# Patient Record
Sex: Male | Born: 2000 | Race: Black or African American | Hispanic: No | Marital: Single | Smoking: Current every day smoker
Health system: Southern US, Community
[De-identification: ages and names within clinical notes are randomized; demographics above are authoritative.]

## PROBLEM LIST (undated history)

## (undated) DIAGNOSIS — R630 Anorexia: Secondary | ICD-10-CM

---
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
## 2019-04-23 ENCOUNTER — Emergency Department (HOSPITAL_COMMUNITY)
Admission: EM | Admit: 2019-04-23 | Discharge: 2019-04-24 | Disposition: A | Discharge status: Home / Self Care | Payer: Medicaid Other | Attending: Emergency Medicine | Admitting: Emergency Medicine

## 2019-04-23 ENCOUNTER — Encounter (HOSPITAL_COMMUNITY): Payer: Self-pay

## 2019-04-23 DIAGNOSIS — X58XXXA Exposure to other specified factors, initial encounter: Secondary | ICD-10-CM | POA: Insufficient documentation

## 2019-04-23 DIAGNOSIS — S0502XA Injury of conjunctiva and corneal abrasion without foreign body, left eye, initial encounter: Secondary | ICD-10-CM | POA: Insufficient documentation

## 2019-04-23 DIAGNOSIS — Y9384 Activity, sleeping: Secondary | ICD-10-CM | POA: Diagnosis not present

## 2019-04-23 DIAGNOSIS — Y999 Unspecified external cause status: Secondary | ICD-10-CM | POA: Insufficient documentation

## 2019-04-23 DIAGNOSIS — Y929 Unspecified place or not applicable: Secondary | ICD-10-CM | POA: Diagnosis not present

## 2019-04-23 DIAGNOSIS — S0592XA Unspecified injury of left eye and orbit, initial encounter: Secondary | ICD-10-CM | POA: Diagnosis present

## 2019-04-23 MED ORDER — TETRACAINE HCL 0.5 % OP SOLN
2.0000 [drp] | Freq: Once | OPHTHALMIC | Status: AC
  Administered 2019-04-24: 2 [drp] via OPHTHALMIC
  Filled 2019-04-23: qty 4

## 2019-04-23 MED ORDER — FLUORESCEIN SODIUM 1 MG OP STRP
1.0000 | ORAL_STRIP | Freq: Once | OPHTHALMIC | Status: AC
  Administered 2019-04-24: 1 via OPHTHALMIC
  Filled 2019-04-23: qty 1

## 2019-04-23 NOTE — ED Triage Notes (Signed)
Pt states that he wore contacts yesterday for Halloween and now eyes are painful

## 2019-04-24 MED ORDER — CIPROFLOXACIN HCL 0.3 % OP SOLN
1.0000 [drp] | OPHTHALMIC | Status: DC
  Administered 2019-04-24: 01:00:00 1 [drp] via OPHTHALMIC

## 2019-04-24 MED ORDER — CIPROFLOXACIN HCL 0.3 % OP SOLN
1.0000 [drp] | OPHTHALMIC | Status: DC
  Filled 2019-04-24: qty 2.5

## 2019-04-24 NOTE — ED Notes (Signed)
Patient verbalizes understanding of discharge instructions. Opportunity for questioning and answers were provided. Armband removed by staff, pt discharged from ED.  

## 2019-04-24 NOTE — Discharge Instructions (Addendum)
Instill 1-2 drops in each eye every 4 hours for the next 5 days.  If your symptoms have not resolved in 2 days, please contact the eye doctor listed.
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
## 2019-06-22 ENCOUNTER — Ambulatory Visit (HOSPITAL_COMMUNITY)
Admission: EM | Admit: 2019-06-22 | Discharge: 2019-06-22 | Disposition: A | Discharge status: Home / Self Care | Payer: Medicaid Other | Attending: Family Medicine | Admitting: Family Medicine

## 2019-06-22 ENCOUNTER — Encounter (HOSPITAL_COMMUNITY): Payer: Self-pay

## 2019-06-22 ENCOUNTER — Other Ambulatory Visit: Payer: Self-pay

## 2019-06-22 DIAGNOSIS — Z20822 Contact with and (suspected) exposure to covid-19: Secondary | ICD-10-CM

## 2019-06-22 DIAGNOSIS — Z20828 Contact with and (suspected) exposure to other viral communicable diseases: Secondary | ICD-10-CM | POA: Insufficient documentation

## 2019-06-22 NOTE — ED Triage Notes (Signed)
Pt. Is here denying ANY symptoms/exposures, just wants COVID testing.

## 2019-06-22 NOTE — ED Provider Notes (Signed)
BP (!) 128/57 (BP Location: Right Arm)   Pulse 84   Temp 98.8 F (37.1 C) (Oral)   Resp 16   SpO2 99%   This is a healthy 18 year old.  He is requesting coronavirus testing.  He denies coronavirus exposure.  He denies coronavirus symptoms.  His reason for testing is that he works in [employer].  Covid testing is performed  Covid information is given to patient and discussed   Raylene Everts, MD 06/22/19 2008

## 2019-06-22 NOTE — Discharge Instructions (Signed)
Your test result will be available on My Chart

## 2019-06-23 LAB — NOVEL CORONAVIRUS, NAA (HOSP ORDER, SEND-OUT TO REF LAB; TAT 18-24 HRS): SARS-CoV-2, NAA: NOT DETECTED

## 2019-07-17 ENCOUNTER — Encounter (HOSPITAL_COMMUNITY): Payer: Self-pay | Admitting: Emergency Medicine

## 2019-07-17 ENCOUNTER — Inpatient Hospital Stay (HOSPITAL_COMMUNITY)
Admission: AD | Admit: 2019-07-17 | Payer: Federal, State, Local not specified - Other | Source: Intra-hospital | Admitting: Psychiatry

## 2019-07-17 ENCOUNTER — Other Ambulatory Visit: Payer: Self-pay

## 2019-07-17 ENCOUNTER — Emergency Department (HOSPITAL_COMMUNITY): Payer: Medicaid Other

## 2019-07-17 ENCOUNTER — Emergency Department (HOSPITAL_COMMUNITY)
Admission: EM | Admit: 2019-07-17 | Discharge: 2019-07-18 | Disposition: A | Discharge status: Other Acute Inpatient Hospital | Payer: Medicaid Other | Attending: Emergency Medicine | Admitting: Emergency Medicine

## 2019-07-17 DIAGNOSIS — Z20822 Contact with and (suspected) exposure to covid-19: Secondary | ICD-10-CM | POA: Diagnosis not present

## 2019-07-17 DIAGNOSIS — Y929 Unspecified place or not applicable: Secondary | ICD-10-CM | POA: Diagnosis not present

## 2019-07-17 DIAGNOSIS — R45851 Suicidal ideations: Secondary | ICD-10-CM | POA: Insufficient documentation

## 2019-07-17 DIAGNOSIS — S3992XA Unspecified injury of lower back, initial encounter: Secondary | ICD-10-CM | POA: Diagnosis present

## 2019-07-17 DIAGNOSIS — Y999 Unspecified external cause status: Secondary | ICD-10-CM | POA: Insufficient documentation

## 2019-07-17 DIAGNOSIS — F329 Major depressive disorder, single episode, unspecified: Secondary | ICD-10-CM | POA: Diagnosis not present

## 2019-07-17 DIAGNOSIS — S39012A Strain of muscle, fascia and tendon of lower back, initial encounter: Secondary | ICD-10-CM | POA: Diagnosis not present

## 2019-07-17 DIAGNOSIS — Y939 Activity, unspecified: Secondary | ICD-10-CM | POA: Diagnosis not present

## 2019-07-17 DIAGNOSIS — J45909 Unspecified asthma, uncomplicated: Secondary | ICD-10-CM | POA: Diagnosis not present

## 2019-07-17 LAB — BASIC METABOLIC PANEL
Anion gap: 9 (ref 5–15)
BUN: 10 mg/dL (ref 6–20)
CO2: 26 mmol/L (ref 22–32)
Calcium: 9.8 mg/dL (ref 8.9–10.3)
Chloride: 104 mmol/L (ref 98–111)
Creatinine, Ser: 0.72 mg/dL (ref 0.61–1.24)
GFR calc Af Amer: >60 mL/min (ref >60)
GFR calc non Af Amer: >60 mL/min (ref >60)
Glucose, Bld: 89 mg/dL (ref 70–99)
Potassium: 3.5 mmol/L (ref 3.5–5.1)
Sodium: 139 mmol/L (ref 135–145)

## 2019-07-17 LAB — ETHANOL: Alcohol, Ethyl (B): <10 mg/dL (ref <10)

## 2019-07-17 LAB — CBC WITH DIFFERENTIAL/PLATELET
Abs Immature Granulocytes: 0.01 10*3/uL (ref 0.00–0.07)
Basophils Absolute: 0 10*3/uL (ref 0.0–0.1)
Basophils Relative: 0 %
Eosinophils Absolute: 0.1 10*3/uL (ref 0.0–0.5)
Eosinophils Relative: 1 %
HCT: 43.7 % (ref 39.0–52.0)
Hemoglobin: 15 g/dL (ref 13.0–17.0)
Immature Granulocytes: 0 %
Lymphocytes Relative: 38 %
Lymphs Abs: 1.8 10*3/uL (ref 0.7–4.0)
MCH: 27.1 pg (ref 26.0–34.0)
MCHC: 34.3 g/dL (ref 30.0–36.0)
MCV: 79 fL — ABNORMAL LOW (ref 80.0–100.0)
Monocytes Absolute: 0.4 10*3/uL (ref 0.1–1.0)
Monocytes Relative: 8 %
Neutro Abs: 2.5 10*3/uL (ref 1.7–7.7)
Neutrophils Relative %: 53 %
Platelets: 254 10*3/uL (ref 150–400)
RBC: 5.53 MIL/uL (ref 4.22–5.81)
RDW: 12.3 % (ref 11.5–15.5)
WBC: 4.7 10*3/uL (ref 4.0–10.5)
nRBC: 0 % (ref 0.0–0.2)

## 2019-07-17 LAB — RESPIRATORY PANEL BY RT PCR (FLU A&B, COVID)
Influenza A by PCR: NEGATIVE
Influenza B by PCR: NEGATIVE
SARS Coronavirus 2 by RT PCR: NEGATIVE

## 2019-07-17 LAB — RAPID URINE DRUG SCREEN, HOSP PERFORMED
Amphetamines: NOT DETECTED
Barbiturates: NOT DETECTED
Benzodiazepines: NOT DETECTED
Cocaine: NOT DETECTED
Opiates: NOT DETECTED
Tetrahydrocannabinol: POSITIVE — AB

## 2019-07-17 MED ORDER — IBUPROFEN 400 MG PO TABS
400.0000 mg | ORAL_TABLET | Freq: Once | ORAL | Status: AC
  Administered 2019-07-17: 400 mg via ORAL
  Filled 2019-07-17: qty 1

## 2019-07-17 MED ORDER — ACETAMINOPHEN 500 MG PO TABS
1000.0000 mg | ORAL_TABLET | Freq: Once | ORAL | Status: AC
  Administered 2019-07-17: 17:00:00 1000 mg via ORAL
  Filled 2019-07-17: qty 2

## 2019-07-17 NOTE — ED Notes (Signed)
Patient provided phone to call Mother back; pt was encouraged to inform family of impending transfer to [facility] later [facility]

## 2019-07-17 NOTE — ED Notes (Signed)
Dinner Tray Ordered @ 1709. 

## 2019-07-17 NOTE — ED Notes (Signed)
Patient mother was very rude and upset asking why we can't just have her talk to patient, I told her we would have patient call her back, she got more upset and rude and than hung up.   Please have patient to call his mother if he wants to.

## 2019-07-17 NOTE — ED Notes (Signed)
Pt changed into wine color scrubs. Belongings inventoried and placed in locker 5. Pt also has valuables with security. Cell phone with ear buds.  Pt compliant and calm at  This time.
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
## 2019-07-17 NOTE — ED Notes (Signed)
TTS in process 
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
## 2019-07-17 NOTE — ED Notes (Signed)
Renaye Rakers, NP, patient meets inpatient criteria.  Hassie Bruce, accepted patient to [facility] Adult Unit,  pending negative COVID test.  Room 302 Bed 2.  Attending is Dr. Jama Flavors.  Arrival time after negative COVID test.  Gabriel Rung, RN, informed of acceptance.  Report [phone].

## 2019-07-18 ENCOUNTER — Encounter (HOSPITAL_COMMUNITY): Payer: Self-pay | Admitting: Psychiatry

## 2019-07-18 ENCOUNTER — Inpatient Hospital Stay (HOSPITAL_COMMUNITY)
Admission: AD | Admit: 2019-07-18 | Discharge: 2019-07-21 | DRG: 881 | Disposition: A | Discharge status: Home / Self Care | Payer: Medicaid Other | Source: Intra-hospital | Attending: Psychiatry | Admitting: Psychiatry

## 2019-07-18 DIAGNOSIS — F329 Major depressive disorder, single episode, unspecified: Principal | ICD-10-CM | POA: Diagnosis present

## 2019-07-18 DIAGNOSIS — R5383 Other fatigue: Secondary | ICD-10-CM | POA: Diagnosis present

## 2019-07-18 DIAGNOSIS — F17290 Nicotine dependence, other tobacco product, uncomplicated: Secondary | ICD-10-CM | POA: Diagnosis present

## 2019-07-18 DIAGNOSIS — F332 Major depressive disorder, recurrent severe without psychotic features: Secondary | ICD-10-CM

## 2019-07-18 DIAGNOSIS — G4700 Insomnia, unspecified: Secondary | ICD-10-CM | POA: Diagnosis present

## 2019-07-18 DIAGNOSIS — Z915 Personal history of self-harm: Secondary | ICD-10-CM

## 2019-07-18 DIAGNOSIS — R45851 Suicidal ideations: Secondary | ICD-10-CM | POA: Diagnosis present

## 2019-07-18 HISTORY — DX: Anorexia: R63.0

## 2019-07-18 MED ORDER — THIAMINE HCL 100 MG PO TABS
100.0000 mg | ORAL_TABLET | Freq: Every day | ORAL | Status: DC
  Administered 2019-07-18 – 2019-07-21 (×4): 100 mg via ORAL
  Filled 2019-07-18 (×6): qty 1

## 2019-07-18 MED ORDER — NICOTINE 14 MG/24HR TD PT24
14.0000 mg | MEDICATED_PATCH | Freq: Every day | TRANSDERMAL | Status: DC
  Administered 2019-07-19 – 2019-07-21 (×3): 14 mg via TRANSDERMAL
  Filled 2019-07-18 (×6): qty 1

## 2019-07-18 MED ORDER — NICOTINE 7 MG/24HR TD PT24
7.0000 mg | MEDICATED_PATCH | Freq: Every day | TRANSDERMAL | Status: DC
  Administered 2019-07-18: 7 mg via TRANSDERMAL
  Filled 2019-07-18 (×3): qty 1

## 2019-07-18 MED ORDER — ALUM & MAG HYDROXIDE-SIMETH 200-200-20 MG/5ML PO SUSP: 30.0000 mL | ORAL | Status: DC | PRN

## 2019-07-18 MED ORDER — MAGNESIUM HYDROXIDE 400 MG/5ML PO SUSP: 30.0000 mL | Freq: Every day | ORAL | Status: DC | PRN

## 2019-07-18 MED ORDER — TRAZODONE HCL 50 MG PO TABS
50.0000 mg | ORAL_TABLET | Freq: Once | ORAL | Status: DC
  Filled 2019-07-18: qty 1

## 2019-07-18 MED ORDER — TRAZODONE HCL 50 MG PO TABS
50.0000 mg | ORAL_TABLET | Freq: Every day | ORAL | Status: DC
  Administered 2019-07-18: 50 mg via ORAL
  Filled 2019-07-18 (×2): qty 1

## 2019-07-18 MED ORDER — HYDROXYZINE HCL 25 MG PO TABS
25.0000 mg | ORAL_TABLET | Freq: Three times a day (TID) | ORAL | Status: DC | PRN
  Administered 2019-07-18 – 2019-07-20 (×3): 25 mg via ORAL
  Filled 2019-07-18 (×3): qty 1

## 2019-07-18 MED ORDER — SERTRALINE HCL 25 MG PO TABS
25.0000 mg | ORAL_TABLET | Freq: Every day | ORAL | Status: DC
  Administered 2019-07-18 – 2019-07-19 (×2): 25 mg via ORAL
  Filled 2019-07-18 (×3): qty 1

## 2019-07-18 MED ORDER — TRAZODONE HCL 50 MG PO TABS: 50.0000 mg | ORAL_TABLET | Freq: Every evening | ORAL | Status: DC | PRN

## 2019-07-18 MED ORDER — FOLIC ACID 1 MG PO TABS
1.0000 mg | ORAL_TABLET | Freq: Every day | ORAL | Status: DC
  Administered 2019-07-18 – 2019-07-21 (×4): 1 mg via ORAL
  Filled 2019-07-18 (×6): qty 1

## 2019-07-18 MED ORDER — ACETAMINOPHEN 325 MG PO TABS: 650.0000 mg | ORAL_TABLET | Freq: Four times a day (QID) | ORAL | Status: DC | PRN

## 2019-07-18 NOTE — Progress Notes (Signed)
   07/18/19 2034  Psych Admission Type (Psych Patients Only)  Admission Status Voluntary  Psychosocial Assessment  Patient Complaints Anxiety;Depression  Eye Contact Brief  Facial Expression Anxious;Sad  Affect Depressed  Speech Logical/coherent  Interaction Assertive  Motor Activity Slow  Appearance/Hygiene Unremarkable  Behavior Characteristics Cooperative;Anxious  Thought Process  Coherency WDL  Content WDL  Delusions None reported or observed  Perception WDL  Hallucination None reported or observed  Judgment Poor  Confusion None  Danger to Self  Current suicidal ideation? Denies  Self-Injurious Behavior No self-injurious ideation or behavior indicators observed or expressed   Danger to Others  Danger to Others None reported or observed   Pt came to nurse's station and asked if it was too late for him to leave the facility. Pt stated that he took the medicine this morning and he doesn't feel a change. Educated pt on fact that it takes at least 3-4 weeks before medication takes effect. Pt stated he only got 2 hours sleep today because "y'all kept waking me up." Pt told that he can have medication for sleep tonight and that this is his first full night on the unit and that it is usually more conducive to sleep. Pt ambivalent but willing to try. Pt denies SI, HI, AVH and pain.

## 2019-07-18 NOTE — Progress Notes (Signed)
Patient denies SI, HI and AVH but reports increased anxiety and depression.  Patient states that he would like to find something to decrease his depression and anxiety.   Assess patient for safety.  Offer medications as prescribed.    Patient able to contract for safety. Continue to monitor as planned.

## 2019-07-18 NOTE — Progress Notes (Signed)
Adult Psychoeducational Group Note  Date:  07/18/2019 Time:  9:32 PM  Group Topic/Focus:  Wrap-Up Group:   The focus of this group is to help patients review their daily goal of treatment and discuss progress on daily workbooks.  Participation Level:  Active  Participation Quality:  Appropriate  Affect:  Appropriate  Cognitive:  Appropriate  Insight: Appropriate  Engagement in Group:  Engaged  Modes of Intervention:  Discussion  Additional Comments:  Patient attended group and said that his day was a 4. He did not have any set goal for today. Patient said he was hallucinating. Nurse was notified.    Lynix Bonine W Saleh Ulbrich 07/18/2019, 9:32 PM
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
## 2019-07-18 NOTE — Tx Team (Signed)
Initial Treatment Plan 07/18/2019 2:13 AM Clarene Duke ZOX:[id number]    PATIENT STRESSORS: Financial difficulties Marital or family conflict Occupational concerns Other: Living w/friend and her family-the family doesn't want him there   PATIENT STRENGTHS: Ability for insight Average or above average intelligence Capable of independent living Communication skills Motivation for treatment/growth   PATIENT IDENTIFIED PROBLEMS: Suicidal ideation with plan  Self-neglect  Depression  Homelessness  Possible PTSD (flashbacks from past traumas)             DISCHARGE CRITERIA:  Adequate post-discharge living arrangements Improved stabilization in mood, thinking, and/or behavior Motivation to continue treatment in a less acute level of care Verbal commitment to aftercare and medication compliance  PRELIMINARY DISCHARGE PLAN: Attend PHP/IOP Outpatient therapy Placement in alternative living arrangements Return to previous work or school arrangements  PATIENT/FAMILY INVOLVEMENT: This treatment plan has been presented to and reviewed with the patient, RAWLEIGH RODE, and/or family member.  The patient and family have been given the opportunity to ask questions and make suggestions.  Victorino December, RN 07/18/2019, 2:13 AM

## 2019-07-18 NOTE — Progress Notes (Signed)
Received report from [facility] on pt coming to [phone].
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
## 2019-07-18 NOTE — Progress Notes (Addendum)
   07/18/19 0153  Psych Admission Type (Psych Patients Only)  Admission Status Voluntary  Psychosocial Assessment  Patient Complaints Anxiety;Worrying;Nervousness;Decreased concentration;Depression  Eye Contact Fair  Facial Expression Anxious;Sad  Affect Depressed  Speech Logical/coherent  Interaction Assertive  Motor Activity Slow  Appearance/Hygiene In scrubs;Unremarkable  Behavior Characteristics Cooperative;Anxious  Thought Process  Coherency WDL  Content WDL  Delusions None reported or observed  Perception WDL  Hallucination None reported or observed  Judgment WDL  Confusion None  Danger to Self  Current suicidal ideation? Passive  Self-Injurious Behavior No self-injurious ideation or behavior indicators observed or expressed   Agreement Not to Harm Self Yes  Description of Agreement verbal  Danger to Others  Danger to Others None reported or observed   Pt contracts for safety.

## 2019-07-18 NOTE — Progress Notes (Signed)
Recreation Therapy Notes  Animal-Assisted Activity (AAA) Program Checklist/Progress Notes Patient Eligibility Criteria Checklist & Daily Group note for Rec Tx Intervention  Date: 1.26.21 Time: 1430 Location: [address]   AAA/T Program Assumption of Risk Form signed by Engineer, production or Parent Legal Guardian  YES   Patient is free of allergies or sever asthma  YES   Patient reports no fear of animals  YES   Patient reports no history of cruelty to animals  YES   Patient understands his/her participation is voluntary  YES  Patient washes hands before animal contact  YES   Patient washes hands after animal contact  YES   Behavioral Response: Engaged  Education: Charity fundraiser, Appropriate Animal Interaction   Education Outcome: Acknowledges understanding/In group clarification offered/Needs additional education.   Clinical Observations/Feedback: Pt attended and participated in activity.     Caroll Rancher, LRT/CTRS         Caroll Rancher A 07/18/2019 3:23 PM

## 2019-07-18 NOTE — BHH Counselor (Signed)
CSW attempted to meet with patient to complete PSA. Patient appeared to be sleeping soundly, he did not respond to CSW knocking at the door or calling name several times.  Enid Cutter, MSW, LCSW-A Clinical Social Worker [facility]  [phone]
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
## 2019-07-19 MED ORDER — QUETIAPINE FUMARATE 50 MG PO TABS
50.0000 mg | ORAL_TABLET | Freq: Every day | ORAL | Status: DC
  Administered 2019-07-19: 22:00:00 50 mg via ORAL
  Filled 2019-07-19 (×3): qty 1

## 2019-07-19 MED ORDER — TRAZODONE HCL 50 MG PO TABS
50.0000 mg | ORAL_TABLET | Freq: Every evening | ORAL | Status: DC | PRN
  Administered 2019-07-19: 50 mg via ORAL
  Filled 2019-07-19: qty 1

## 2019-07-19 MED ORDER — SERTRALINE HCL 50 MG PO TABS
50.0000 mg | ORAL_TABLET | Freq: Every day | ORAL | Status: DC
  Administered 2019-07-20 – 2019-07-21 (×2): 50 mg via ORAL
  Filled 2019-07-19 (×4): qty 1

## 2019-07-19 MED ORDER — SERTRALINE HCL 25 MG PO TABS
25.0000 mg | ORAL_TABLET | ORAL | Status: AC
  Administered 2019-07-19: 25 mg via ORAL
  Filled 2019-07-19: qty 1

## 2019-07-19 NOTE — Tx Team (Signed)
Interdisciplinary Treatment and Diagnostic Plan Update  07/19/2019 Time of Session: 9:00am Randy Vasquez MRN: [id number]  Principal Diagnosis: <principal problem not specified>  Secondary Diagnoses: Active Problems:   MDD (major depressive disorder)   Current Medications:  Current Facility-Administered Medications  Medication Dose Route Frequency Provider Last Rate Last Admin  . acetaminophen (TYLENOL) tablet 650 mg  650 mg Oral Q6H PRN Anike, Adaku C, NP      . alum & mag hydroxide-simeth (MAALOX/MYLANTA) 200-200-20 MG/5ML suspension 30 mL  30 mL Oral Q4H PRN Anike, Adaku C, NP      . folic acid (FOLVITE) tablet 1 mg  1 mg Oral Daily Antonieta Pert, MD   1 mg at 07/19/19 0803  . hydrOXYzine (ATARAX/VISTARIL) tablet 25 mg  25 mg Oral TID PRN Anike, Adaku C, NP   25 mg at 07/18/19 2101  . magnesium hydroxide (MILK OF MAGNESIA) suspension 30 mL  30 mL Oral Daily PRN Anike, Adaku C, NP      . nicotine (NICODERM CQ - dosed in mg/24 hours) patch 14 mg  14 mg Transdermal Daily Anike, Adaku C, NP   14 mg at 07/19/19 0803  . sertraline (ZOLOFT) tablet 25 mg  25 mg Oral Daily Antonieta Pert, MD   25 mg at 07/19/19 0803  . thiamine tablet 100 mg  100 mg Oral Daily Antonieta Pert, MD   100 mg at 07/19/19 0803  . traZODone (DESYREL) tablet 50 mg  50 mg Oral QHS Antonieta Pert, MD   50 mg at 07/18/19 2101  . traZODone (DESYREL) tablet 50 mg  50 mg Oral Once Anike, Adaku C, NP       PTA Medications: No medications prior to admission.    Patient Stressors: Financial difficulties Marital or family conflict Occupational concerns Other: Living w/friend and her family-the family doesn't want him there  Patient Strengths: Ability for insight Average or above average intelligence Capable of independent living Manufacturing systems engineer Motivation for treatment/growth  Treatment Modalities: Medication Management, Group therapy, Case management,  1 to 1 session with clinician,  Psychoeducation, Recreational therapy.   Physician Treatment Plan for Primary Diagnosis: <principal problem not specified> Long Term Goal(s): Improvement in symptoms so as ready for discharge Improvement in symptoms so as ready for discharge   Short Term Goals: Ability to identify changes in lifestyle to reduce recurrence of condition will improve Ability to verbalize feelings will improve Ability to disclose and discuss suicidal ideas Ability to demonstrate self-control will improve Ability to identify and develop effective coping behaviors will improve  Medication Management: Evaluate patient's response, side effects, and tolerance of medication regimen.  Therapeutic Interventions: 1 to 1 sessions, Unit Group sessions and Medication administration.  Evaluation of Outcomes: Progressing  Physician Treatment Plan for Secondary Diagnosis: Active Problems:   MDD (major depressive disorder)  Long Term Goal(s): Improvement in symptoms so as ready for discharge Improvement in symptoms so as ready for discharge   Short Term Goals: Ability to identify changes in lifestyle to reduce recurrence of condition will improve Ability to verbalize feelings will improve Ability to disclose and discuss suicidal ideas Ability to demonstrate self-control will improve Ability to identify and develop effective coping behaviors will improve     Medication Management: Evaluate patient's response, side effects, and tolerance of medication regimen.  Therapeutic Interventions: 1 to 1 sessions, Unit Group sessions and Medication administration.  Evaluation of Outcomes: Progressing   RN Treatment Plan for Primary Diagnosis: <principal problem not specified> Long  Term Goal(s): Knowledge of disease and therapeutic regimen to maintain health will improve  Short Term Goals: Ability to verbalize feelings will improve, Ability to disclose and discuss suicidal ideas and Ability to identify and develop effective  coping behaviors will improve  Medication Management: RN will administer medications as ordered by provider, will assess and evaluate patient's response and provide education to patient for prescribed medication. RN will report any adverse and/or side effects to prescribing provider.  Therapeutic Interventions: 1 on 1 counseling sessions, Psychoeducation, Medication administration, Evaluate responses to treatment, Monitor vital signs and CBGs as ordered, Perform/monitor CIWA, COWS, AIMS and Fall Risk screenings as ordered, Perform wound care treatments as ordered.  Evaluation of Outcomes: Progressing   LCSW Treatment Plan for Primary Diagnosis: <principal problem not specified> Long Term Goal(s): Safe transition to appropriate next level of care at discharge, Engage patient in therapeutic group addressing interpersonal concerns.  Short Term Goals: Engage patient in aftercare planning with referrals and resources, Increase social support, Increase emotional regulation, Identify triggers associated with mental health/substance abuse issues and Increase skills for wellness and recovery  Therapeutic Interventions: Assess for all discharge needs, 1 to 1 time with Social worker, Explore available resources and support systems, Assess for adequacy in community support network, Educate family and significant other(s) on suicide prevention, Complete Psychosocial Assessment, Interpersonal group therapy.  Evaluation of Outcomes: Progressing  Progress in Treatment: Attending groups: No. Participating in groups: No. Taking medication as prescribed: Yes. Toleration medication: Yes. Family/Significant other contact made: No, will contact:  supports if consents are granted. Patient understands diagnosis: Yes. Discussing patient identified problems/goals with staff: Yes. Medical problems stabilized or resolved: Yes. Denies suicidal/homicidal ideation: No. Issues/concerns per patient self-inventory:  Yes.  New problem(s) identified: Yes, Describe:  homelessness.  New Short Term/Long Term Goal(s): detox, medication management for mood stabilization; elimination of SI thoughts; development of comprehensive mental wellness/sobriety plan.  Patient Goals: "Figure out what's going on with me."  Discharge Plan or Barriers: Newly admitted to unit, CSW assessing for appropriate referrals.  Reason for Continuation of Hospitalization: Anxiety Depression Medication stabilization  Estimated Length of Stay: 2-4 days  Attendees: Patient: Randy Vasquez 07/19/2019 9:26 AM  Physician: Queen Blossom 07/19/2019 9:26 AM  Nursing: Legrand Como, RN 07/19/2019 9:26 AM  RN Care Manager: 07/19/2019 9:26 AM  Social Worker: Stephanie Acre, [place] 07/19/2019 9:26 AM  Recreational Therapist:  07/19/2019 9:26 AM  Other: Harriett Sine, NP 07/19/2019 9:26 AM  Other:  07/19/2019 9:26 AM  Other: 07/19/2019 9:26 AM    Scribe for Treatment Team: Joellen [place], [place] 07/19/2019 9:26 AM

## 2019-07-19 NOTE — Progress Notes (Signed)
Provider gave order for an additional dose of Trazodone 50 mg. Pt has fallen asleep before medication could be given. Pt not disturbed. Medication will be available if pt wakes up.

## 2019-07-19 NOTE — Plan of Care (Signed)
Progress note  D: pt found in bed; compliant with medication administration. Pt is minimal in their assessment. Pt seems preoccupied at times and may be thought blocking though pt denies. Pt is bizarre and stoic in mannerism. Pt is pleasant though. Pt denies si/hi/ah/vh and verbally agrees to approach staff if these become apparent or before harming themself/others while at [facility].  A: Pt provided support and encouragement. Pt given medication per protocol and standing orders. Q42m safety checks implemented and continued.  R: Pt safe on the unit. Will continue to monitor.  Pt progressing in the following metrics  Problem: Education: Goal: Knowledge of [facility] Education information/materials will improve Outcome: Progressing Goal: Emotional status will improve Outcome: Progressing Goal: Mental status will improve Outcome: Progressing Goal: Verbalization of understanding the information provided will improve Outcome: Progressing
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
## 2019-07-19 NOTE — [facility] Counselor (Signed)
CSW attempted to meet with patient a second time to complete PSA and discuss aftercare.  Patient attended treatment team earlier this morning and appeared to be drowsy. CSW knocked at the door several times and called patient's name. Patient did not respond to CSW to awake.  CSW will follow up.  Enid Cutter, MSW, LCSW-A Clinical Social Worker [facility]  [phone]

## 2019-07-19 NOTE — Progress Notes (Signed)
Patient did not attend wrap up group. 

## 2019-07-19 NOTE — [facility] Group Notes (Signed)
LCSW Group Therapy Note  07/19/2019 2:01 PM  Type of Therapy/Topic: Group Therapy: Feelings about Diagnosis  Participation Level: Active   Description of Group:  This group will allow patients to explore their thoughts and feelings about diagnoses they have received. Patients will be guided to explore their level of understanding and acceptance of these diagnoses. Facilitator will encourage patients to process their thoughts and feelings about the reactions of others to their diagnosis and will guide patients in identifying ways to discuss their diagnosis with significant others in their lives. This group will be process-oriented, with patients participating in exploration of their own experiences, giving and receiving support, and processing challenge from other group members.  Therapeutic Goals: 1. Patient will demonstrate understanding of diagnosis as evidenced by identifying two or more symptoms of the disorder 2. Patient will be able to express two feelings regarding the diagnosis 3. Patient will demonstrate their ability to communicate their needs through discussion and/or role play  Summary of Patient Progress: Randy Vasquez shared at length his experiences with mental health and identifying as LGBTQ. He shared that he is concerned that his mental health issues are perceived as a "gay thing," and not taken seriously. Randy Vasquez shared that his mother and his sister are his primary supports.  Therapeutic Modalities:  Cognitive Behavioral Therapy Brief Therapy Feelings Identification   Enid Cutter, MSW, [facility] Clinical Social Worker

## 2019-07-19 NOTE — Progress Notes (Signed)
   07/19/19 2055  Psych Admission Type (Psych Patients Only)  Admission Status Voluntary  Psychosocial Assessment  Patient Complaints Anxiety  Eye Contact Brief  Facial Expression Anxious;Pensive;Sullen;Sad  Affect Anxious;Depressed;Preoccupied;Sad;Sullen  Speech Logical/coherent;Soft  Interaction Cautious;Forwards little;Guarded  Motor Activity Slow  Appearance/Hygiene Unremarkable  Thought Process  Coherency WDL  Content Ambivalence  Delusions None reported or observed  Perception WDL  Hallucination None reported or observed  Judgment Poor  Confusion None  Danger to Self  Current suicidal ideation? Denies  Self-Injurious Behavior No self-injurious ideation or behavior indicators observed or expressed   Agreement Not to Harm Self Yes  Description of Agreement verbal  Danger to Others  Danger to Others None reported or observed
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
## 2019-07-20 MED ORDER — QUETIAPINE FUMARATE 100 MG PO TABS
100.0000 mg | ORAL_TABLET | Freq: Every day | ORAL | Status: DC
  Administered 2019-07-20: 100 mg via ORAL
  Filled 2019-07-20 (×3): qty 1

## 2019-07-20 NOTE — Progress Notes (Signed)
D:  Patient denied SI and HI, contracts for safety.  Denied A/V hallucinations.  Denied pain. A:  Medications administered per MD orders.  Emotional support and encouragement given patient. R:  Safety maintained with 15 minute checks.  Patient sitting in dayroom watching TV with peers.  Patient will discuss discharge with MD.  Patient stated he does not need to be here, that he wants to discharge asap.

## 2019-07-20 NOTE — Progress Notes (Addendum)
Patient stated he feels like he can't trust any of the [facility] staff.  He feels that everyone is talking about him.  Before admission to [facility] he felt like he was being persecuted by other people at work, people that are around him.  Patient stated he does not want to be here, that he wants to be discharged.  Patient stayed he wants to leave, that he wants to go to his friend's house or his mother's house.  That he just wants to leave from here.  He just does not want to be here.   Patient stated the only medicine that works for him is his sleep medication.

## 2019-07-20 NOTE — Progress Notes (Signed)
Adult Psychoeducational Group Note  Date:  07/20/2019 Time:  9:26 PM  Group Topic/Focus:  Wrap-Up Group:   The focus of this group is to help patients review their daily goal of treatment and discuss progress on daily workbooks.  Participation Level:  Active  Participation Quality:  Appropriate  Affect:  Appropriate  Cognitive:  Appropriate  Insight: Appropriate  Engagement in Group:  Engaged  Modes of Intervention:  Discussion  Additional Comments:  Patient attended group and said that his day was a 7.  He isn't experiencing any SI or HI. He is only requesting better medication for his anxiety.   Kevon Tench W Zaylin Runco 07/20/2019, 9:26 PM

## 2019-07-20 NOTE — Progress Notes (Signed)
   07/20/19 2218  Psych Admission Type (Psych Patients Only)  Admission Status Voluntary  Psychosocial Assessment  Patient Complaints Anxiety;Depression;Other (Comment)  Eye Contact Fair  Facial Expression Anxious;Pensive;Sullen;Sad  Affect Anxious;Depressed  Speech Logical/coherent;Soft  Interaction Forwards little;Assertive  Motor Activity Slow  Appearance/Hygiene Unremarkable  Behavior Characteristics Cooperative;Anxious  Mood Depressed;Anxious  Thought Process  Coherency WDL  Content WDL  Delusions None reported or observed  Perception WDL  Hallucination None reported or observed  Judgment Poor  Confusion None  Danger to Self  Current suicidal ideation? Denies  Agreement Not to Harm Self Yes  Description of Agreement verbal  Danger to Others  Danger to Others None reported or observed   Pt denies SI, HI, AVH and pain. Pt says that his body is weak because he is anorexic and doesn't eat. Pt, on admission, said he only eats candy. Pt said he ate some chicken today and he is starting to feel better. Pt educated on the need to eat more solid food. Pt contracts for safety.

## 2019-07-20 NOTE — Plan of Care (Signed)
Nurse discussed patient's feelings this afternoon, his desire to be discharged asap.  Patient will discuss with MD.

## 2019-07-20 NOTE — [facility] Suicide Risk Assessment (Signed)
[facility] INPATIENT:  Family/Significant Other Suicide Prevention Education  Suicide Prevention Education:  Education Completed; with grandmother, Remo Lipps ([phone]) has been identified by the patient as the family member/significant other with whom the patient will be residing, and identified as the person(s) who will aid the patient in the event of a mental health crisis (suicidal ideations/suicide attempt).  With written consent from the patient, the family member/significant other has been provided the following suicide prevention education, prior to the and/or following the discharge of the patient.  The suicide prevention education provided includes the following:  Suicide risk factors  Suicide prevention and interventions  National Suicide Hotline telephone number  [facility] assessment telephone number  [facility] Emergency Assistance 911  [facility] and/or Residential Mobile Crisis Unit telephone number  Request made of family/significant other to:  Remove weapons (e.g., guns, rifles, knives), all items previously/currently identified as safety concern.    Remove drugs/medications (over-the-counter, prescriptions, illicit drugs), all items previously/currently identified as a safety concern.  The family member/significant other verbalizes understanding of the suicide prevention education information provided.  The family member/significant other agrees to remove the items of safety concern listed above.  Talbert Forest reports she is very concerned about the patient's well-being once he leaves the hospital. She states the patient is living in a dysfunctional/toxic environment and cannot "progress" if he returns. She reports she is willing to help the patient pay for a studio apartment in order for him to have his own place.   Talbert Forest also states she would like for the patient to be referred to an outpatient psychiatrist and therapist at discharge. CSW ensured  the patient's grandmother that referrals were being made and the patient was agreeable. Patient's grandmother reports she does not feel the patient is ready for discharge at this time.  CSW will continue to follow.     Maeola Sarah 07/20/2019, 2:09 PM
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
## 2019-07-21 MED ORDER — HYDROXYZINE HCL 25 MG PO TABS: 25.0000 mg | ORAL_TABLET | Freq: Three times a day (TID) | ORAL | 0 refills | Status: DC | PRN

## 2019-07-21 MED ORDER — QUETIAPINE FUMARATE 100 MG PO TABS: 100.0000 mg | ORAL_TABLET | Freq: Every day | ORAL | 0 refills | Status: DC

## 2019-07-21 MED ORDER — NICOTINE 14 MG/24HR TD PT24: 14.0000 mg | MEDICATED_PATCH | Freq: Every day | TRANSDERMAL | 0 refills | Status: DC

## 2019-07-21 MED ORDER — TRAZODONE HCL 50 MG PO TABS: 50.0000 mg | ORAL_TABLET | Freq: Every evening | ORAL | 0 refills | Status: DC | PRN

## 2019-07-21 MED ORDER — SERTRALINE HCL 50 MG PO TABS: 50.0000 mg | ORAL_TABLET | Freq: Every day | ORAL | 0 refills | Status: DC

## 2019-07-21 NOTE — Progress Notes (Signed)
D. Pt presents with a sad affect, brightens a little during interactions.  Pt currently denies SI/HI and AVH  A. Labs and vitals monitored. Pt given and educated on medications. Pt supported emotionally and encouraged to express concerns and ask questions.   R. Pt remains safe with 15 minute checks. Will continue POC.
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
## 2019-07-21 NOTE — BHH Suicide Risk Assessment (Signed)
[facility] Discharge Suicide Risk Assessment   Principal Problem: <principal problem not specified> Discharge Diagnoses: Active Problems:   MDD (major depressive disorder)   Total Time spent with patient: 20 minutes  Musculoskeletal: Strength & Muscle Tone: within normal limits Gait & Station: normal Patient leans: N/A  Psychiatric Specialty Exam: Review of Systems  All other systems reviewed and are negative.   Blood pressure 113/79, pulse 68, temperature 97.9 F (36.6 C), resp. rate 16, height 5\' 6"  (1.676 m), weight 56.7 kg, SpO2 100 %.Body mass index is 20.18 kg/m.  General Appearance: Casual  Eye Contact::  Fair  Speech:  Normal Rate409  Volume:  Normal  Mood:  Anxious  Affect:  Congruent  Thought Process:  Coherent and Descriptions of Associations: Intact  Orientation:  Full (Time, Place, and Person)  Thought Content:  Logical  Suicidal Thoughts:  No  Homicidal Thoughts:  No  Memory:  Immediate;   Fair Recent;   Fair Remote;   Fair  Judgement:  Fair  Insight:  Fair  Psychomotor Activity:  Normal  Concentration:  Fair  Recall:  [phone] of Knowledge:Fair  Language: Good  Akathisia:  Negative  Handed:  Right  AIMS (if indicated):     Assets:  Desire for Improvement Resilience  Sleep:  Number of Hours: 6.25  Cognition: WNL  ADL's:  Intact   Mental Status Per Nursing Assessment::   On Admission:  Suicidal ideation indicated by patient  Demographic Factors:  Male, Adolescent or young adult, Gay, lesbian, or bisexual orientation, Low socioeconomic status and Unemployed  Loss Factors: NA  Historical Factors: Impulsivity  Risk Reduction Factors:   Living with another person, especially a relative  Continued Clinical Symptoms:  Severe Anxiety and/or Agitation Depression:   Impulsivity More than one psychiatric diagnosis Previous Psychiatric Diagnoses and Treatments  Cognitive Features That Contribute To Risk:  None    Suicide Risk:  Minimal: No  identifiable suicidal ideation.  Patients presenting with no risk factors but with morbid ruminations; may be classified as minimal risk based on the severity of the depressive symptoms  Follow-up Information    [facility] Follow up on 07/28/2019.   Why: You are scheduled for an appointment on 07/28/19 at 10:30 am.  This is a virtual appointment via telephone.  Please have your insurance information and your discharge summary available. Contact information: [address] [place] [phone]           Plan Of Care/Follow-up recommendations:  Activity:  ad lib  [phone], MD 07/21/2019, 7:52 AM

## 2019-07-21 NOTE — Progress Notes (Signed)
  [facility] Adult Case Management Discharge Plan :  Will you be returning to the same living situation after discharge:  Yes,  staying with friends. At discharge, do you have transportation home?: Yes,  Safe Transportation at 11:30am. Do you have the ability to pay for your medications: Yes,  Medicaid.  Release of information consent forms completed and in the chart. Letter on chart.  Patient to Follow up at: Follow-up Information    [facility] Follow up on 07/28/2019.   Why: You are scheduled for an appointment on 07/28/19 at 10:30 am.  This is a virtual appointment via telephone.  Please have your insurance information and your discharge summary available. Contact information: [address] [place] [address] [phone]           Next level of care provider has access to [facility]:no  Safety Planning and Suicide Prevention discussed: Yes,  with grandmother.  Have you used any form of tobacco in the last 30 days? (Cigarettes, Smokeless Tobacco, Cigars, and/or Pipes): Yes  Has patient been referred to the Quitline?: Patient refused referral  Patient has been referred for addiction treatment: Yes  Darreld Mclean, LCSWA 07/21/2019, 8:53 AM

## 2020-03-04 ENCOUNTER — Encounter (HOSPITAL_COMMUNITY): Payer: Self-pay | Admitting: Emergency Medicine

## 2020-03-04 ENCOUNTER — Emergency Department (HOSPITAL_COMMUNITY)
Admission: EM | Admit: 2020-03-04 | Discharge: 2020-03-04 | Disposition: A | Discharge status: Home / Self Care | Payer: Medicaid Other | Attending: Emergency Medicine | Admitting: Emergency Medicine

## 2020-03-04 DIAGNOSIS — F1290 Cannabis use, unspecified, uncomplicated: Secondary | ICD-10-CM | POA: Insufficient documentation

## 2020-03-04 DIAGNOSIS — K6289 Other specified diseases of anus and rectum: Secondary | ICD-10-CM | POA: Diagnosis not present

## 2020-03-04 DIAGNOSIS — K649 Unspecified hemorrhoids: Secondary | ICD-10-CM | POA: Diagnosis present

## 2020-03-04 DIAGNOSIS — A630 Anogenital (venereal) warts: Secondary | ICD-10-CM | POA: Diagnosis not present

## 2020-03-04 DIAGNOSIS — F17290 Nicotine dependence, other tobacco product, uncomplicated: Secondary | ICD-10-CM | POA: Insufficient documentation

## 2020-03-04 LAB — URINALYSIS, ROUTINE W REFLEX MICROSCOPIC
Bilirubin Urine: NEGATIVE
Glucose, UA: NEGATIVE mg/dL
Hgb urine dipstick: NEGATIVE
Ketones, ur: NEGATIVE mg/dL
Leukocytes,Ua: NEGATIVE
Nitrite: NEGATIVE
Protein, ur: NEGATIVE mg/dL
Specific Gravity, Urine: 1.021 (ref 1.005–1.030)
pH: 6 (ref 5.0–8.0)

## 2020-03-04 LAB — HIV ANTIBODY (ROUTINE TESTING W REFLEX): HIV Screen 4th Generation wRfx: NONREACTIVE

## 2020-03-04 MED ORDER — DOXYCYCLINE HYCLATE 100 MG PO CAPS: 100.0000 mg | ORAL_CAPSULE | Freq: Two times a day (BID) | ORAL | 0 refills | Status: AC

## 2020-03-04 MED ORDER — CEFTRIAXONE SODIUM 500 MG IJ SOLR
500.0000 mg | Freq: Once | INTRAMUSCULAR | Status: AC
  Administered 2020-03-04: 500 mg via INTRAMUSCULAR
  Filled 2020-03-04: qty 500

## 2020-03-04 MED ORDER — LIDOCAINE HCL (PF) 1 % IJ SOLN
INTRAMUSCULAR | Status: AC
  Filled 2020-03-04: qty 5

## 2020-03-04 MED ORDER — LIDOCAINE HCL (PF) 1 % IJ SOLN
1.0000 mL | Freq: Once | INTRAMUSCULAR | Status: AC
  Administered 2020-03-04: 1 mL

## 2020-03-04 MED ORDER — AZITHROMYCIN 250 MG PO TABS
1000.0000 mg | ORAL_TABLET | Freq: Once | ORAL | Status: AC
  Administered 2020-03-04: 1000 mg via ORAL
  Filled 2020-03-04: qty 4

## 2020-03-04 NOTE — ED Triage Notes (Signed)
Pt states he has had a hemorrhoid that he first noticed in April 21 and has progressively became more painful and swollen it also seems to drain at times.

## 2020-03-04 NOTE — Discharge Instructions (Addendum)
I am concerned that your symptoms are due to gonorrhea or chlamydia infection of the anus. I have treated you with antibiotics you will need to take a full week of doxycycline once in the morning once in the evening to treat this infection. The swabs, HIV, syphilis and other tests will result in several days. You may check your MyChart app to see the results of these. Regardless, please follow-up with your primary care doctor. If you do not have 1 please follow-up with [facility] as information I have included below.  You have been treated in the emergency department for an infection, possibly sexually transmitted. Results of your gonorrhea and chlamydia tests are pending and you will be notified if they are positive. Please refrain from intercourse for 7 days and until all sex partners (within previous 60 days) are evaluated and/or treated as well. Please follow up with your primary care provider for continued care and further STD evaluation.  It is very important to practice safe sex and use condoms when sexually active. If your results are positive you need to notify all sexual partners so they can be treated as well. The website [email or web address].net/ can be used to send anonymous text messages or emails to alert sexual contacts. Follow up with your doctor, or OBGYN in regards to today's visit.   Gonorrhea and Chlamydia SYMPTOMS  In females, symptoms may go unnoticed. Symptoms that are more noticeable can include:  Belly (abdominal) pain.  Painful intercourse.  Watery mucous-like discharge from the vagina.  Miscarriage.  Discomfort when urinating.  Inflammation of the rectum.  Abnormal gray-green frothy vaginal discharge  Vaginal itching and irritatio  Itching and irritation of the area outside the vagina.   Painful urination.  Bleeding after sexual intercourse.  In males, symptoms include:  Burning with urination.  Pain in the testicles.  Watery mucous-like  discharge from the penis.  It can cause longstanding (chronic) pelvic pain after frequent infections.  TREATMENT  PID can cause women to not be able to have children (sterile) if left untreated or if half-treated.  It is important to finish ALL medications given to you.  This is a sexually transmitted infection. So you are also at risk for other sexually transmitted diseases, including HIV (AIDS), it is recommended that you get tested. HOME CARE INSTRUCTIONS  Warning: This infection is contagious. Do not have sex until treatment is completed. Follow up at your caregiver's office or the clinic to which you were referred. If your diagnosis (learning what is wrong) is confirmed by culture or some other method, your recent sexual contacts need treatment. Even if they are symptom free or have a negative culture or evaluation, they should be treated.  PREVENTION  Women should use sanitary pads instead of tampons for vaginal discharge.  Wipe front to back after using the toilet and avoid douching.   Practice safe sex, use condoms, have only one sex partner and be sure your sex partner is not having sex with others.  Ask your caregiver to test you for chlamydia at your regular checkups or sooner if you are having symptoms.  Ask for further information if you are pregnant.  SEEK IMMEDIATE MEDICAL CARE IF:  You develop an oral temperature above 102 F (38.9 C), not controlled by medications or lasting more than 2 days.  You develop an increase in pain.  You develop any type of abnormal discharge.  You develop vaginal bleeding and it is not time for your period.  You develop painful intercourse.   Bacterial Vaginosis  Bacterial vaginosis (BV) is a vaginal infection where the normal balance of bacteria in the vagina is disrupted. This is not a sexually transmitted disease and your sexual partners do NOT need to be treated. CAUSES  The cause of BV is not fully understood. BV develops when there is an  increase or imbalance of harmful bacteria.  Some activities or behaviors can upset the normal balance of bacteria in the vagina and put women at increased risk including:  Having a new sex partner or multiple sex partners.  Douching.  Using an intrauterine device (IUD) for contraception.  It is not clear what role sexual activity plays in the development of BV. However, women that have never had sexual intercourse are rarely infected with BV.  Women do not get BV from toilet seats, bedding, swimming pools or from touching objects around them.   SYMPTOMS  Grey vaginal discharge.  A fish-like odor with discharge, especially after sexual intercourse.  Itching or burning of the vagina and vulva.  Burning or pain with urination.  Some women have no signs or symptoms at all.   TREATMENT  Sometimes BV will clear up without treatment.  BV may be treated with antibiotics.  BV can recur after treatment. If this happens, a second round of antibiotics will often be prescribed.  HOME CARE INSTRUCTIONS  Finish all medication as directed by your caregiver.  Do not have sex until treatment is completed.  Do NOT drink any alcoholic beverages while being treated  with Metronidazole (Flagyl). This will cause a severe reaction inducing vomiting.  RESOURCE GUIDE  Dental Problems  Patients with Medicaid: [employer] [phone]                                           [phone] [address] [employer] Phone:  [phone]                                                  Phone:  [phone]  If unable to pay or uninsured, contact:  [facility] or [facility]. to become qualified for the adult dental clinic.  Chronic Pain Problems Contact [facility]  [phone] Patients need to be referred by their primary care doctor.  Insufficient Money for Medicine Contact United Way:  call "211" or [facility] 250-685-4454.  No  Primary Care Doctor Call [facility]  [phone] Other agencies that provide inexpensive medical care    [facility]  [phone]    [facility]  [phone]    [facility]  [phone]    [facility]  [phone]    [facility]  [phone]    [facility]  [phone]  Psychological Services [facility] Behavioral Health  [phone] [facility]  [phone] [facility] Mental Health   [phone] (emergency services [phone])  Substance Abuse Resources [facility]  5155412768 [facility] [phone] [facility] [phone] Floydene Flock [phone] Residential & Outpatient Substance Abuse Program  [phone]  Abuse/Neglect [place]  [address] Child Abuse Hotline 602-048-9260 [facility] Child Abuse Hotline 601-605-6934 (After Hours)  Emergency Shelter [facility] [phone]  Maternity Homes Room at the [place] [facility] [place] 7260122423 [facility] Services [phone]  MRSA Hotline #:   778-444-6956    [facility] Resources  [facility] [place]     United Way                          [facility] Dept. [address] [place]                       [address]      [address] [place] [address]  Blondell Reveal Phone:  [phone]                                   Phone:  [phone]                 Phone:  [phone]  [facility] Mental Health Phone:  [phone]  [facility] Child Abuse Hotline [phone] [phone] (After Hours)

## 2020-03-04 NOTE — ED Provider Notes (Signed)
[facility] EMERGENCY DEPARTMENT Provider Note   CSN: [id number] Arrival date & time: 03/04/20  1449     History Chief Complaint  Patient presents with  . Hemorrhoids    Randy Vasquez is a 19 y.o. male.  HPI  Patient is a 19 year old male with past medical history of anal receptive sex presented today with some abnormal growth on his anus concern for hemorrhoid. He states that he first noticed this in April but states that it become progressively more painful. He states that he also has noticed some drainage that he states is white/colorless. He states he has no significant pain with defecation. He states that he has had no fevers or chills.  Denies any nausea vomiting or abdominal pain.  He states apart from some pain on his anus he is not having any symptoms.  Patient states that he has historically used condoms with sexual activity but states sometimes he does not use barrier protection.     Past Medical History:  Diagnosis Date  . Anorexia    per pt    Patient Active Problem List   Diagnosis Date Noted  . MDD (major depressive disorder) 07/18/2019    No past surgical history on file.     Family History  Problem Relation Age of Onset  . Healthy Mother   . Healthy Father     Social History   Tobacco Use  . Smoking status: Current Every Day Smoker    Packs/day: 0.50    Types: Cigars  . Smokeless tobacco: Never Used  . Tobacco comment: 2-3 Black and Mild cigars daily  Vaping Use  . Vaping Use: Never assessed  Substance Use Topics  . Alcohol use: No  . Drug use: Yes    Types: Marijuana    Home Medications Prior to Admission medications   Medication Sig Start Date End Date Taking? Authorizing Provider  doxycycline (VIBRAMYCIN) 100 MG capsule Take 1 capsule (100 mg total) by mouth 2 (two) times daily for 7 days. 03/04/20 03/11/20  Gailen Shelter, PA  hydrOXYzine (ATARAX/VISTARIL) 25 MG tablet Take 1 tablet (25 mg total) by mouth 3  (three) times daily as needed for anxiety. 07/21/19   Armandina Stammer I, NP  nicotine (NICODERM CQ - DOSED IN MG/24 HOURS) 14 mg/24hr patch Place 1 patch (14 mg total) onto the skin daily. (May buy from over the counter): For smoking cessation 07/22/19   Armandina Stammer I, NP  QUEtiapine (SEROQUEL) 100 MG tablet Take 1 tablet (100 mg total) by mouth at bedtime. For mood control 07/21/19   Armandina Stammer I, NP  sertraline (ZOLOFT) 50 MG tablet Take 1 tablet (50 mg total) by mouth daily. For depression 07/22/19   Armandina Stammer I, NP  traZODone (DESYREL) 50 MG tablet Take 1 tablet (50 mg total) by mouth at bedtime as needed for sleep. 07/21/19   Sanjuana Kava, NP    Allergies    Patient has no known allergies.  Review of Systems   Review of Systems  Constitutional: Negative for chills and fever.  HENT: Negative for congestion.   Respiratory: Negative for shortness of breath.   Cardiovascular: Negative for chest pain.  Gastrointestinal: Negative for abdominal pain.       Anus pain  Musculoskeletal: Negative for neck pain.    Physical Exam Updated Vital Signs BP 109/74 (BP Location: Left Arm)   Pulse 63   Temp 98 F (36.7 C) (Oral)   Resp 16   SpO2  99%   Physical Exam Vitals and nursing note reviewed.  Constitutional:      General: He is not in acute distress. HENT:     Head: Normocephalic and atraumatic.     Nose: Nose normal.  Eyes:     General: No scleral icterus. Cardiovascular:     Rate and Rhythm: Normal rate and regular rhythm.     Pulses: Normal pulses.     Heart sounds: Normal heart sounds.  Pulmonary:     Effort: Pulmonary effort is normal. No respiratory distress.     Breath sounds: No wheezing.  Abdominal:     Palpations: Abdomen is soft.     Tenderness: There is no abdominal tenderness.  Genitourinary:    Rectum: Normal.     Comments: Anus with 9'oclock position verrucous growth that is approximately 44mm in diameter. No bleeding or discharge.  Rectal exam without  palpable fluctuance or abnormality.  Musculoskeletal:     Cervical back: Normal range of motion.     Right lower leg: No edema.     Left lower leg: No edema.  Skin:    General: Skin is warm and dry.     Capillary Refill: Capillary refill takes less than 2 seconds.  Neurological:     Mental Status: He is alert. Mental status is at baseline.  Psychiatric:        Mood and Affect: Mood normal.        Behavior: Behavior normal.     ED Results / Procedures / Treatments   Labs (all labs ordered are listed, but only abnormal results are displayed) Labs Reviewed  WET PREP, GENITAL  URINALYSIS, ROUTINE W REFLEX MICROSCOPIC  HIV ANTIBODY (ROUTINE TESTING W REFLEX)  RPR  GC/CHLAMYDIA PROBE AMP ([place]) NOT AT [facility]    EKG None  Radiology No results found.  Procedures Procedures (including critical care time)  Medications Ordered in ED Medications  cefTRIAXone (ROCEPHIN) injection 500 mg (500 mg Intramuscular Given 03/04/20 1808)  lidocaine (PF) (XYLOCAINE) 1 % injection 1 mL (1 mL Other Given 03/04/20 1808)  azithromycin (ZITHROMAX) tablet 1,000 mg (1,000 mg Oral Given 03/04/20 1808)    ED Course  I have reviewed the triage vital signs and the nursing notes.  Pertinent labs & imaging results that were available during my care of the patient were reviewed by me and considered in my medical decision making (see chart for details).    MDM Rules/Calculators/A&P                          Patient is 19 year old male with a past medical history without any significant positives.  He is a MSM with anal receptive sex with part of his sexual repertoire.  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  He has no obvious discharge on my examination but given his history of this will swab and treat empirically for gonorrhea and chlamydia.  Patient given return precautions and states he is agreeable to follow-up with PCP will follow up on his labs done today.   Will test for HIV, RPR, gonorrhea chlamydia.  Urinalysis without any abnormalities.  Patient given empiric treatment and prescription for doxycycline for 1 week.  He will follow closely with PCP he was also given information for [facility] Department of [employer].  Final Clinical Impression(s) / ED Diagnoses Final diagnoses:  Anal warts  Rectal pain    Rx / DC Orders ED Discharge  Orders         Ordered    doxycycline (VIBRAMYCIN) 100 MG capsule  2 times daily        03/04/20 1843           Gailen Shelter, [place] 03/05/20 5379    Maia Plan, MD 03/05/20 [phone]

## 2020-03-04 NOTE — ED Notes (Signed)
Pt became "woozy" when RN was drawing blood.  He was already sitting on the side of the bed however RN and tec assisted him back in the bed.  Pt was alert the entire time.  Episode only lasted about 45 seconds and quickly resolved.  Pt was then oriented and able to ambulate w/o any assistance at this time.  Vitals stable.  Provider was notified and pt was discharged.

## 2020-03-05 LAB — RPR
RPR Ser Ql: REACTIVE — AB
RPR Titer: 1:128 {titer}

## 2020-03-05 LAB — GC/CHLAMYDIA PROBE AMP (~~LOC~~) NOT AT ARMC
Chlamydia: POSITIVE — AB
Comment: NEGATIVE
Comment: NORMAL
Neisseria Gonorrhea: NEGATIVE

## 2020-03-06 LAB — T.PALLIDUM AB, TOTAL: T Pallidum Abs: REACTIVE — AB

## 2020-04-18 ENCOUNTER — Emergency Department (HOSPITAL_COMMUNITY)
Admission: EM | Admit: 2020-04-18 | Discharge: 2020-04-19 | Disposition: A | Discharge status: Home / Self Care | Payer: Medicaid Other | Attending: Emergency Medicine | Admitting: Emergency Medicine

## 2020-04-18 DIAGNOSIS — X789XXA Intentional self-harm by unspecified sharp object, initial encounter: Secondary | ICD-10-CM | POA: Insufficient documentation

## 2020-04-18 DIAGNOSIS — S50812A Abrasion of left forearm, initial encounter: Secondary | ICD-10-CM | POA: Diagnosis not present

## 2020-04-18 DIAGNOSIS — Z7289 Other problems related to lifestyle: Secondary | ICD-10-CM

## 2020-04-18 DIAGNOSIS — F329 Major depressive disorder, single episode, unspecified: Secondary | ICD-10-CM | POA: Diagnosis present

## 2020-04-18 DIAGNOSIS — F332 Major depressive disorder, recurrent severe without psychotic features: Secondary | ICD-10-CM | POA: Insufficient documentation

## 2020-04-18 DIAGNOSIS — Z23 Encounter for immunization: Secondary | ICD-10-CM | POA: Diagnosis not present

## 2020-04-18 DIAGNOSIS — Z20822 Contact with and (suspected) exposure to covid-19: Secondary | ICD-10-CM | POA: Insufficient documentation

## 2020-04-18 DIAGNOSIS — F17290 Nicotine dependence, other tobacco product, uncomplicated: Secondary | ICD-10-CM | POA: Diagnosis not present

## 2020-04-18 DIAGNOSIS — S59912A Unspecified injury of left forearm, initial encounter: Secondary | ICD-10-CM | POA: Diagnosis present

## 2020-04-18 DIAGNOSIS — T1491XA Suicide attempt, initial encounter: Secondary | ICD-10-CM

## 2020-04-19 ENCOUNTER — Other Ambulatory Visit: Payer: Self-pay

## 2020-04-19 DIAGNOSIS — M25552 Pain in left hip: Secondary | ICD-10-CM | POA: Diagnosis present

## 2020-04-19 DIAGNOSIS — F17290 Nicotine dependence, other tobacco product, uncomplicated: Secondary | ICD-10-CM | POA: Diagnosis not present

## 2020-04-19 DIAGNOSIS — M25522 Pain in left elbow: Secondary | ICD-10-CM | POA: Diagnosis not present

## 2020-04-19 DIAGNOSIS — Y9301 Activity, walking, marching and hiking: Secondary | ICD-10-CM | POA: Diagnosis not present

## 2020-04-19 DIAGNOSIS — Y92810 Car as the place of occurrence of the external cause: Secondary | ICD-10-CM | POA: Insufficient documentation

## 2020-04-19 DIAGNOSIS — W228XXA Striking against or struck by other objects, initial encounter: Secondary | ICD-10-CM | POA: Diagnosis not present

## 2020-04-19 DIAGNOSIS — M79641 Pain in right hand: Secondary | ICD-10-CM | POA: Diagnosis not present

## 2020-04-19 LAB — RESPIRATORY PANEL BY RT PCR (FLU A&B, COVID)
Influenza A by PCR: NEGATIVE
Influenza B by PCR: NEGATIVE
SARS Coronavirus 2 by RT PCR: NEGATIVE

## 2020-04-19 MED ORDER — ALUM & MAG HYDROXIDE-SIMETH 200-200-20 MG/5ML PO SUSP: 30.0000 mL | Freq: Four times a day (QID) | ORAL | Status: DC | PRN

## 2020-04-19 MED ORDER — TETANUS-DIPHTH-ACELL PERTUSSIS 5-2.5-18.5 LF-MCG/0.5 IM SUSY
0.5000 mL | PREFILLED_SYRINGE | Freq: Once | INTRAMUSCULAR | Status: AC
  Administered 2020-04-19: 0.5 mL via INTRAMUSCULAR
  Filled 2020-04-19: qty 0.5

## 2020-04-19 MED ORDER — IBUPROFEN 200 MG PO TABS: 600.0000 mg | ORAL_TABLET | Freq: Three times a day (TID) | ORAL | Status: DC | PRN

## 2020-04-19 MED ORDER — ONDANSETRON HCL 4 MG PO TABS: 4.0000 mg | ORAL_TABLET | Freq: Three times a day (TID) | ORAL | Status: DC | PRN

## 2020-04-19 NOTE — ED Notes (Signed)
Pt DC d off unit to home per provider. Pt alert, calm, cooperative, no s/s of distress. Pt denied SI at this time. DC information given to and reviewed with pt, pt acknowledged understanding. Belongings given to pt. Pt ambulatory off unit. Pt given bus passes for transportation.

## 2020-04-19 NOTE — BH Assessment (Signed)
Comprehensive Clinical Assessment (CCA) Screening, Triage and Referral Note  04/19/2020 Randy Vasquez [id number]   Per EDP, "The patient reports that earlier the patient's sister told her that he can no longer live with her and he will have to move out in January.  While he was doing homework earlier tonight, he became increasingly angry and used a small metal knife to attempt to slit his left wrist.  He reports that he became angry that he was unable to cut more deeply.  He then proceeded to cut his left forearm multiple times, but states that the subsequent wounds were not an SI attempt."   During assessment pt presented calm and cooperative, pt states that he did attempt to cut his wrist with a knife multiple times, states knife was too dull, he appears to have very small cuts on his arm from previous attempts as well. Pt currently denies SI, HI, AVH. Pt states that his sister kicked him out of the house due to verbal argument they had. He states that he is currently in school and trying to get back on the right track. Pt reports getting 8 hours of sleep with a good appetite Pt states he does have a hx of depression, pt states he currently does not have a provider and not taking any medications at this time. Pt denies any drug abuse at this time, current UDS pending. Pt states he does not want to miss school and feels he can keep self safe, however per pt chart hx he was last assessed earlier this year January for similar presentation of self harm and was hospitalized for a few days at [facility]. Pt to remain at ED for observation tonight, reassess in the morning.   Disposition:Jason, Allyson Sabal, FNP recommends overnight observation, per MD pt is to remain at ED. Diagnosis:MDD, recurrent, severe    Visit Diagnosis: No diagnosis found.  Patient Reported Information How did you hear about [place]? Other (Comment) (EMS)   Referral name: EMS (EMS)   Referral phone number: No data recorded Whom do you  see for routine medical problems? I don't have a doctor   Practice/Facility Name: No data recorded  Practice/Facility Phone Number: No data recorded  Name of Contact: No data recorded  Contact Number: No data recorded  Contact Fax Number: No data recorded  Prescriber Name: No data recorded  Prescriber Address (if known): No data recorded What Is the Reason for Your Visit/Call Today? No data recorded How Long Has This Been Causing You Problems? 1 wk - 1 month  Have You Recently Been in Any Inpatient Treatment (Hospital/Detox/Crisis Center/28-Day Program)? No   Name/Location of Program/Hospital:No data recorded  How Long Were You There? No data recorded  When Were You Discharged? No data recorded Have You Ever Received Services From [facility] Before? Yes   Who Do You [facility] at [facility]? [facility]  Have You Recently Had Any Thoughts About Hurting Yourself? No   Are You Planning to Commit Suicide/Harm Yourself At This time?  Yes  Have you Recently Had Thoughts About Hurting Someone Karolee Ohs? No   Explanation: No data recorded Have You Used Any Alcohol or Drugs in the Past 24 Hours? No   How Long Ago Did You Use Drugs or Alcohol?  No data recorded  What Did You Use and How Much? No data recorded What Do You Feel Would Help You the Most Today? Assessment Only  Do You Currently Have a Therapist/Psychiatrist? No   Name of  Therapist/Psychiatrist: No data recorded  Have You Been Recently Discharged From Any Office Practice or Programs? No   Explanation of Discharge From Practice/Program:  No data recorded    CCA Screening Triage Referral Assessment Type of Contact: Tele-Assessment   Is this Initial or Reassessment? Initial Assessment   Date Telepsych consult ordered in [facility]:  04/19/20   Time Telepsych consult ordered in [facility]:  No data recorded Patient Reported Information Reviewed? Yes   Patient Left Without Being Seen? No data recorded  Reason for Not Completing  Assessment: No data recorded Collateral Involvement: No data recorded Does Patient Have a Court Appointed Legal Guardian? No data recorded  Name and Contact of Legal Guardian:  No data recorded If Minor and Not Living with Parent(s), Who has Custody? No data recorded Is CPS involved or ever been involved? Never  Is APS involved or ever been involved? Never  Patient Determined To Be At Risk for Harm To Self or Others Based on Review of Patient Reported Information or Presenting Complaint? No   Method: No data recorded  Availability of Means: No data recorded  Intent: No data recorded  Notification Required: No data recorded  Additional Information for Danger to Others Potential:  No data recorded  Additional Comments for Danger to Others Potential:  No data recorded  Are There Guns or Other Weapons in Your Home?  No    Types of Guns/Weapons: No data recorded   Are These Weapons Safely Secured?                              No data recorded   Who Could Verify You Are Able To Have These Secured:    No data recorded Do You Have any Outstanding Charges, Pending Court Dates, Parole/Probation? No data recorded Contacted To Inform of Risk of Harm To Self or Others: No data recorded Location of Assessment: [facility] Assessment Services  Does Patient Present under Involuntary Commitment? No   IVC Papers Initial File Date: No data recorded  [place] of Residence: [place]  Patient Currently Receiving the Following Services: Not Receiving Services   Determination of Need: Routine (7 days)   Options For Referral: Overnight Observation  Natasha Mead, LCSWA

## 2020-04-19 NOTE — Consult Note (Signed)
[facility] Psych ED Discharge  04/19/2020 9:28 AM Randy Vasquez  MRN:  [id number] Principal Problem: MDD (major depressive disorder) Discharge Diagnoses: Principal Problem:   MDD (major depressive disorder)   Subjective: Patient states "last night when my sister told me I had to get out of the house by January of felt overwhelmed and acted impulsively."  Patient denies that he intended to kill himself last night.  Patient reports "I was just angry."  Patient denies suicidal ideations currently.  Patient endorses 2 prior suicide attempts last in January 2021.  Patient reports he is not currently followed by outpatient psychiatry and would prefer not to establish psychiatric care.  Patient states "I do not want to take any medications, I feel like these make the symptoms worse."  Patient reports he was admitted to inpatient psychiatric treatment in January of this year there he started Seroquel and Zoloft but discontinued these medications immediately after discharge.    Patient reports readiness to discharge home.  Patient reports he would like information on homeless shelters as he prefers not to return to his sister's home at this time.  Patient reports he is also considering residing in his aunt's home if she will allow this  Patient reports he has most recently resided with his sister in [place].  Patient denies access to weapons.  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  Patient denies alcohol and drug use currently.  Patient reports history of marijuana use but reports his sister removed his marijuana and he has not had any "in a while."  Patient endorses 8 hours of sleep each day and an average appetite.  Patient assessed by nurse practitioner.  Patient pleasant cooperative during assessment.  Patient alert and oriented, answers appropriately.  Patient denies homicidal ideations.  Patient denies both auditory and visual  hallucinations.  There is no evidence of delusional thought content and patient does not appear to be responding to internal stimuli.  Patient denies symptoms of paranoia.  Patient denies any person to contact for collateral information.  Patient reports his sister spoke with his mother and extended family and believes "they are on her side."  Total Time spent with patient: 30 minutes  Past Psychiatric History: Major depressive disorder  Past Medical History:  Past Medical History:  Diagnosis Date   Anorexia    per pt   No past surgical history on file. Family History:  Family History  Problem Relation Age of Onset   Healthy Mother    Healthy Father    Family Psychiatric  History: None reported Social History:  Social History   Substance and Sexual Activity  Alcohol Use No     Social History   Substance and Sexual Activity  Drug Use Yes   Types: Marijuana    Social History   Socioeconomic History   Marital status: Single    Spouse name: Not on file   Number of children: Not on file   Years of education: Not on file   Highest education level: High school graduate  Occupational History   Not on file  Tobacco Use   Smoking status: Current Every Day Smoker    Packs/day: 0.50    Types: Cigars   Smokeless tobacco: Never Used   Tobacco comment: 2-3 Black and Mild cigars daily  Vaping Use   Vaping Use: Never assessed  Substance and Sexual Activity   Alcohol use: No   Drug use: Yes  Types: Marijuana   Sexual activity: Yes    Birth control/protection: Condom  Other Topics Concern   Not on file  Social History Narrative   Not on file   Social Determinants of Health   Financial Resource Strain:    Difficulty of Paying Living Expenses: Not on file  Food Insecurity:    Worried About Programme researcher, broadcasting/film/video in the Last Year: Not on file   [employer] of Food in the Last Year: Not on file  Transportation Needs:    Lack of Transportation (Medical):  Not on file   Lack of Transportation (Non-Medical): Not on file  Physical Activity:    Days of Exercise per Week: Not on file   Minutes of Exercise per Session: Not on file  Stress:    Feeling of Stress : Not on file  Social Connections:    Frequency of Communication with Friends and Family: Not on file   Frequency of Social Gatherings with Friends and Family: Not on file   Attends Religious Services: Not on file   Active Member of Clubs or Organizations: Not on file   Attends Banker Meetings: Not on file   Marital Status: Not on file    Has this patient used any form of tobacco in the last 30 days? (Cigarettes, Smokeless Tobacco, Cigars, and/or Pipes) A prescription for an FDA-approved tobacco cessation medication was offered at discharge and the patient refused  Current Medications: Current Facility-Administered Medications  Medication Dose Route Frequency Provider Last Rate Last Admin   alum & mag hydroxide-simeth (MAALOX/MYLANTA) 200-200-20 MG/5ML suspension 30 mL  30 mL Oral Q6H PRN McDonald, Mia A, PA-C       ibuprofen (ADVIL) tablet 600 mg  600 mg Oral Q8H PRN McDonald, Mia A, PA-C       ondansetron (ZOFRAN) tablet 4 mg  4 mg Oral Q8H PRN McDonald, Mia A, PA-C       No current outpatient medications on file.   PTA Medications: (Not in a hospital admission)   Musculoskeletal: Strength & Muscle Tone: within normal limits Gait & Station: normal Patient leans: N/A  Psychiatric Specialty Exam: Physical Exam Vitals and nursing note reviewed.  Constitutional:      Appearance: He is well-developed.  HENT:     Head: Normocephalic.  Cardiovascular:     Rate and Rhythm: Normal rate.  Pulmonary:     Effort: Pulmonary effort is normal.  Neurological:     Mental Status: He is alert and oriented to person, place, and time.  Psychiatric:        Attention and Perception: Attention and perception normal.        Mood and Affect: Mood and affect  normal.        Speech: Speech normal.        Behavior: Behavior normal. Behavior is cooperative.        Thought Content: Thought content normal.        Cognition and Memory: Cognition and memory normal.        Judgment: Judgment normal.     Review of Systems  Constitutional: Negative.   HENT: Negative.   Eyes: Negative.   Respiratory: Negative.   Cardiovascular: Negative.   Gastrointestinal: Negative.   Genitourinary: Negative.   Musculoskeletal: Negative.   Skin: Negative.   Neurological: Negative.   Psychiatric/Behavioral: Negative.     Blood pressure 107/72, pulse 70, temperature 98.7 F (37.1 C), temperature source Oral, resp. rate 16, height 5\' 6"  (1.676 m),  weight 56.7 kg, SpO2 97 %.Body mass index is 20.18 kg/m.  General Appearance: Casual and Fairly Groomed  Eye Contact:  Good  Speech:  Clear and Coherent and Normal Rate  Volume:  Normal  Mood:  Depressed  Affect:  Appropriate and Congruent  Thought Process:  Coherent, Goal Directed and Descriptions of Associations: Intact  Orientation:  Full (Time, Place, and Person)  Thought Content:  Logical  Suicidal Thoughts:  No  Homicidal Thoughts:  No  Memory:  Immediate;   Good Recent;   Good Remote;   Good  Judgement:  Good  Insight:  Fair  Psychomotor Activity:  Normal  Concentration:  Concentration: Good and Attention Span: Good  Recall:  Good  Fund of Knowledge:  Good  Language:  Good  Akathisia:  No  Handed:  Right  AIMS (if indicated):     Assets:  Communication Skills Desire for Improvement Financial Resources/Insurance Physical Health Resilience Social Support Vocational/Educational  ADL's:  Intact  Cognition:  WNL  Sleep:        Demographic Factors:  Male and Unemployed  Loss Factors: NA  Historical Factors: Impulsivity  Risk Reduction Factors:   Positive social support, Positive therapeutic relationship and Positive coping skills or problem solving skills  Continued Clinical Symptoms:   Previous Psychiatric Diagnoses and Treatments  Cognitive Features That Contribute To Risk:  None    Suicide Risk:  Minimal: No identifiable suicidal ideation.  Patients presenting with no risk factors but with morbid ruminations; may be classified as minimal risk based on the severity of the depressive symptoms    Plan Of Care/Follow-up recommendations:  Other:  Patient reviewed with Dr. Bronwen Betters.  Recommend follow-up with outpatient psychiatry and counseling. Social work consult ordered to address homeless concerns.  Disposition: Patient cleared by psychiatry. Patrcia Dolly, FNP 04/19/2020, 9:28 AM

## 2020-04-19 NOTE — ED Notes (Addendum)
Pt calm, cooperative, guarded. No SI noted.

## 2020-04-19 NOTE — ED Notes (Signed)
Belongings placed in locker 27 

## 2020-04-19 NOTE — ED Provider Notes (Signed)
Patient cleared by psychiatry and discharged in ED in good condition.   Virgina Norfolk, DO 04/19/20 [phone]

## 2020-04-19 NOTE — Discharge Instructions (Signed)
For your behavioral health needs, you are advised to follow up with [facility].  Contact them at your earliest opportunity to arrange for intake:       [facility]      [address]      [place], [place] [address]      [phone]      They offer psychiatry/medication management and therapy, along with a variety of other services.  New patients are being seen in their walk-in clinic.  Walk-in hours are Monday - Thursday from 8:00 am - 11:00 am for psychiatry, and Friday from 1:00 pm - 4:00 pm for therapy.  Walk-in patients are seen on a first come, first served basis, so try to arrive as early as possible for the best chance of being seen the same day.

## 2020-04-19 NOTE — BH Assessment (Signed)
[facility] Assessment Progress Note  Per Berneice Heinrich, NP, this pt does not require psychiatric hospitalization at this time.  Pt is psychiatrically cleared.  Discharge instructions advise pt to follow up with [facility] for psychiatry and therapy.  EDP Virgina Norfolk, DO and pt's nurse, Beth, have been notified.  Doylene Canning, MA Triage Specialist [phone]

## 2020-04-19 NOTE — ED Triage Notes (Signed)
Patient is alert and oriented x4/ ambulatory. He is here voluntary. Tried to cut his wrist. He is not on any medications.His sister kiscked him out.   BP: 118/70- 70 100% RA  Calm/ cooperative.

## 2020-04-19 NOTE — ED Provider Notes (Signed)
[facility]-EMERGENCY DEPT Provider Note   CSN: [id number] Arrival date & time: 04/18/20  2333     History Chief Complaint  Patient presents with  . Suicide Attempt    Randy Vasquez is a 19 y.o. male with a history of depression who presents the emergency department with a chief complaint of SI.  The patient reports that earlier the patient's sister told her that he can no longer live with her and he will have to move out in January.  While he was doing homework earlier tonight, he became increasingly angry and used a small metal knife to attempt to slit his left wrist.  He reports that he became angry that he was unable to cut more deeply.  He then proceeded to cut his left forearm multiple times, but states that the subsequent wounds were not an SI attempt.  He denies HI or auditory or visual hallucinations.  He has no other complaints at this time including fever, chills, chest pain, abdominal pain, nausea, vomiting, diarrhea, shortness of breath, or URI symptoms.  He is unsure when his Tdap was last updated.  He denies alcohol use.  He reports that he smokes Black and mild and marijuana.  No other illicit or recreational substance use.  He has no chronic medical conditions.  He previously had a behavioral health admission in January 2021.  The history is provided by the patient and medical records. No language interpreter was used.       Past Medical History:  Diagnosis Date  . Anorexia    per pt    Patient Active Problem List   Diagnosis Date Noted  . MDD (major depressive disorder) 07/18/2019    No past surgical history on file.     Family History  Problem Relation Age of Onset  . Healthy Mother   . Healthy Father     Social History   Tobacco Use  . Smoking status: Current Every Day Smoker    Packs/day: 0.50    Types: Cigars  . Smokeless tobacco: Never Used  . Tobacco comment: 2-3 Black and Mild cigars daily  Vaping Use  .  Vaping Use: Never assessed  Substance Use Topics  . Alcohol use: No  . Drug use: Yes    Types: Marijuana    Home Medications Prior to Admission medications   Medication Sig Start Date End Date Taking? Authorizing Provider  QUEtiapine (SEROQUEL) 100 MG tablet Take 1 tablet (100 mg total) by mouth at bedtime. For mood control Patient not taking: Reported on 04/19/2020 07/21/19 04/19/20  Armandina Stammer I, NP  sertraline (ZOLOFT) 50 MG tablet Take 1 tablet (50 mg total) by mouth daily. For depression Patient not taking: Reported on 04/19/2020 07/22/19 04/19/20  Armandina Stammer I, NP  traZODone (DESYREL) 50 MG tablet Take 1 tablet (50 mg total) by mouth at bedtime as needed for sleep. Patient not taking: Reported on 04/19/2020 07/21/19 04/19/20  Armandina Stammer I, NP    Allergies    Patient has no known allergies.  Review of Systems   Review of Systems  Constitutional: Negative for appetite change and fever.  HENT: Negative for congestion.   Respiratory: Negative for shortness of breath.   Cardiovascular: Negative for chest pain.  Gastrointestinal: Negative for abdominal pain, diarrhea, nausea and vomiting.  Genitourinary: Negative for dysuria.  Musculoskeletal: Negative for back pain, myalgias, neck pain and neck stiffness.  Skin: Negative for rash and wound.  Allergic/Immunologic: Negative for immunocompromised state.  Neurological:  Negative for seizures, syncope, weakness, numbness and headaches.  Psychiatric/Behavioral: Positive for self-injury and suicidal ideas. Negative for agitation, confusion and hallucinations.    Physical Exam Updated Vital Signs BP 107/72 (BP Location: Left Arm)   Pulse 70   Temp 98.7 F (37.1 C) (Oral)   Resp 16   Ht 5\' 6"  (1.676 m)   Wt 56.7 kg   SpO2 97%   BMI 20.18 kg/m   Physical Exam Vitals and nursing note reviewed.  Constitutional:      General: He is not in acute distress.    Appearance: He is well-developed. He is not ill-appearing,  toxic-appearing or diaphoretic.  HENT:     Head: Normocephalic.  Eyes:     Conjunctiva/sclera: Conjunctivae normal.  Cardiovascular:     Rate and Rhythm: Normal rate and regular rhythm.     Pulses: Normal pulses.     Heart sounds: Normal heart sounds. No murmur heard.  No gallop.   Pulmonary:     Effort: Pulmonary effort is normal. No respiratory distress.     Breath sounds: No stridor. No wheezing, rhonchi or rales.  Chest:     Chest wall: No tenderness.  Abdominal:     General: There is no distension.     Palpations: Abdomen is soft. There is no mass.     Tenderness: There is no abdominal tenderness. There is no right CVA tenderness, left CVA tenderness, guarding or rebound.     Hernia: No hernia is present.  Musculoskeletal:     Cervical back: Neck supple.  Skin:    General: Skin is warm and dry.     Comments: Numerous superficial abrasions noted to the ventral surface of the left forearm.  No lacerations.  The deepest is wound is a transverse wound to the wrist crease.  Wounds are hemostatic.  Neurovascular intact throughout the left upper extremity.  Neurological:     Mental Status: He is alert.  Psychiatric:        Attention and Perception: He does not perceive auditory hallucinations.        Mood and Affect: Affect is flat.        Behavior: Behavior normal. Behavior is cooperative.        Thought Content: Thought content includes suicidal ideation. Thought content does not include homicidal ideation. Thought content includes suicidal plan. Thought content does not include homicidal plan.        Judgment: Judgment is impulsive.     ED Results / Procedures / Treatments   Labs (all labs ordered are listed, but only abnormal results are displayed) Labs Reviewed  RESPIRATORY PANEL BY RT PCR (FLU A&B, COVID)    EKG None  Radiology No results found.  Procedures Procedures (including critical care time)  Medications Ordered in ED Medications  ibuprofen (ADVIL)  tablet 600 mg (has no administration in time range)  ondansetron (ZOFRAN) tablet 4 mg (has no administration in time range)  alum & mag hydroxide-simeth (MAALOX/MYLANTA) 200-200-20 MG/5ML suspension 30 mL (has no administration in time range)  Tdap (BOOSTRIX) injection 0.5 mL (0.5 mLs Intramuscular Given 04/19/20 0211)    ED Course  I have reviewed the triage vital signs and the nursing notes.  Pertinent labs & imaging results that were available during my care of the patient were reviewed by me and considered in my medical decision making (see chart for details).    MDM Rules/Calculators/A&P  19 year old male with a history of depression presenting to the ER after an intentional suicide attempt where he attempted to slit his wrist with a knife after getting into an argument with his sister.  Patient reports that he is no longer feeling suicidal at this time.  No HI or auditory visualizations.  Vital signs are normal.  Patient has no chronic medical conditions.  COVID-19 test has been ordered.  Labs have been reviewed and independently interpreted by me.  Patient did not test positive for COVID-19 or influenza.  At this time, I do not feel that medical screening labs are otherwise indicated given patient's presentation and lack of chronic medical conditions.  Tdap was updated today as there was no previous record of immunization in his chart.  The patient was discussed with Dr. Read Drivers, attending physician.  Pt medically cleared at this time. Psych hold orders and home med orders placed. TTS consulted who initially recommended overnight observation and transfer to [facility].  However, patient expressed concern that he would want to leave if he had to be transferred.  Unfortunately, since he did voice a suicide attempt, he would need to be IVC if he attempted to leave until he was cleared by psychiatry.  I then spoke with Nira Conn, psychiatry NP, who was agreeable to having the  patient stay overnight in the ER with a.m. evaluation by psychiatry.    Patient was updated with this plan and is agreeable at this time. However, he is aware that if he leaves prior to being discharged by psychiatry that he will need to be IVC.  Please see psych team notes for further documentation of care/dispo. Pt stable at time of med clearance.    Final Clinical Impression(s) / ED Diagnoses Final diagnoses:  Suicide attempt [facility])  Self-mutilation    Rx / DC Orders ED Discharge Orders    None       Alarik Radu A, PA-C 04/19/20 0649    Molpus, Jonny Ruiz, MD 04/19/20 [phone]

## 2020-04-19 NOTE — TOC Initial Note (Addendum)
Transition of Care [facility]) - Initial/Assessment Note    Patient Details  Name: Randy Vasquez MRN: [id number] Date of Birth: Nov 03, 2000  Transition of [facility] CM/SW Contact:    Iqra Rotundo C Tarpley-Carter, LCSWA Phone Number: 04/19/2020, 10:37 AM  Clinical Narrative:                 [facility] CM/CSW consulted with pt about his need for shelter and transportation.  Pt stated was going to stay with Aunt, because he no longer can stay with sister. CSW also actively listened to pt about his situation.  CSW shared resources of area shelters, and encouraged pt to continue to seek employment and encouragement.   CSW inquired to pt about transportation home.  Pt stated he has a ride home.  CSW shared these facts with Waynetta Sandy, Charity fundraiser.  Beth, RN assisted pt with phone to call family for safe dc.    Herberto Ledwell Tarpley-Carter, MSW, LCSW-A Pronouns:  She, Her, Hers                  [facility] Transitions of CareClinical Social Worker [email or web address] [phone]     Patient Goals and CMS Choice  To contact Aunt for shelter and transportation home.      Expected Discharge Plan and Services  Pt's Aunt will be picking pt up and pt will also live with her.                                              Prior Living Arrangements/Services  Living with sister.                     Activities of Daily Living      Permission Sought/Granted                  Emotional Assessment  Pt is stable, alert, and oriented x4.            Admission diagnosis:  SI Patient Active Problem List   Diagnosis Date Noted  . MDD (major depressive disorder) 07/18/2019   PCP:  Patient, No Pcp Per Pharmacy:   [facility] [address], [place] - [address] [place] [address] Phone: [phone] Fax: [phone]  [id number] - [place], [place] - [address] [place]  [address] Phone: [phone] Fax: [phone]  [id number] [place], [place] - [address] AT [facility] [address] [place] [address] Phone: [phone] Fax: [phone]     Social Determinants of Health (SDOH) Interventions    Readmission Risk Interventions No flowsheet data found.

## 2020-04-20 ENCOUNTER — Other Ambulatory Visit: Payer: Self-pay

## 2020-04-20 ENCOUNTER — Encounter (HOSPITAL_COMMUNITY): Payer: Self-pay

## 2020-04-20 ENCOUNTER — Emergency Department (HOSPITAL_COMMUNITY): Payer: Medicaid Other

## 2020-04-20 ENCOUNTER — Emergency Department (HOSPITAL_COMMUNITY)
Admission: EM | Admit: 2020-04-20 | Discharge: 2020-04-20 | Disposition: A | Discharge status: Home / Self Care | Payer: Medicaid Other | Attending: Emergency Medicine | Admitting: Emergency Medicine
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
## 2020-04-20 NOTE — Discharge Instructions (Addendum)
If anything changes or worsens, return to the ER.
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
